# Patient Record
Sex: Female | Born: 1960 | Race: White | Hispanic: No | Marital: Married | Smoking: Never smoker
Health system: Southern US, Community
[De-identification: ages and names within clinical notes are randomized; demographics above are authoritative.]

## PROBLEM LIST (undated history)

## (undated) DIAGNOSIS — E119 Type 2 diabetes mellitus without complications: Secondary | ICD-10-CM

## (undated) DIAGNOSIS — I10 Essential (primary) hypertension: Secondary | ICD-10-CM

## (undated) DIAGNOSIS — F419 Anxiety disorder, unspecified: Secondary | ICD-10-CM

## (undated) DIAGNOSIS — K219 Gastro-esophageal reflux disease without esophagitis: Secondary | ICD-10-CM

## (undated) DIAGNOSIS — E079 Disorder of thyroid, unspecified: Secondary | ICD-10-CM

## (undated) HISTORY — PX: CHOLECYSTECTOMY: SHX55

---

## 2004-06-22 ENCOUNTER — Ambulatory Visit: Payer: Self-pay | Admitting: Internal Medicine

## 2005-08-22 ENCOUNTER — Ambulatory Visit: Payer: Self-pay | Admitting: Internal Medicine

## 2006-08-25 ENCOUNTER — Ambulatory Visit: Payer: Self-pay | Admitting: Internal Medicine

## 2008-04-13 ENCOUNTER — Ambulatory Visit: Payer: Self-pay | Admitting: Internal Medicine

## 2009-05-23 ENCOUNTER — Ambulatory Visit: Payer: Self-pay | Admitting: Internal Medicine

## 2010-05-09 ENCOUNTER — Ambulatory Visit: Payer: Self-pay | Admitting: Internal Medicine

## 2010-07-04 ENCOUNTER — Ambulatory Visit: Payer: Self-pay | Admitting: Internal Medicine

## 2011-07-11 ENCOUNTER — Ambulatory Visit: Payer: Self-pay | Admitting: Internal Medicine

## 2012-07-13 ENCOUNTER — Ambulatory Visit: Payer: Self-pay | Admitting: Internal Medicine

## 2013-09-29 ENCOUNTER — Ambulatory Visit: Payer: Self-pay | Admitting: Nurse Practitioner

## 2014-09-22 ENCOUNTER — Ambulatory Visit: Payer: Self-pay

## 2014-10-28 ENCOUNTER — Ambulatory Visit: Payer: Self-pay | Admitting: Nurse Practitioner

## 2015-06-24 ENCOUNTER — Encounter: Payer: Self-pay | Admitting: Gynecology

## 2015-06-24 ENCOUNTER — Ambulatory Visit
Admission: EM | Admit: 2015-06-24 | Discharge: 2015-06-24 | Disposition: A | Discharge status: Home / Self Care | Payer: BLUE CROSS/BLUE SHIELD | Attending: Internal Medicine | Admitting: Internal Medicine

## 2015-06-24 DIAGNOSIS — S86911A Strain of unspecified muscle(s) and tendon(s) at lower leg level, right leg, initial encounter: Secondary | ICD-10-CM

## 2015-06-24 DIAGNOSIS — S86811A Strain of other muscle(s) and tendon(s) at lower leg level, right leg, initial encounter: Secondary | ICD-10-CM | POA: Diagnosis not present

## 2015-06-24 HISTORY — DX: Essential (primary) hypertension: I10

## 2015-06-24 HISTORY — DX: Type 2 diabetes mellitus without complications: E11.9

## 2015-06-24 HISTORY — DX: Anxiety disorder, unspecified: F41.9

## 2015-06-24 HISTORY — DX: Gastro-esophageal reflux disease without esophagitis: K21.9

## 2015-06-24 HISTORY — DX: Disorder of thyroid, unspecified: E07.9

## 2015-06-24 NOTE — ED Notes (Signed)
Observed pt in [place], can bend and extend R knee/leg, able to walk on it but it hurts, feels it is "moving around" when she walks. Dr. Dayton ScrapeMurray notified.

## 2015-06-24 NOTE — ED Provider Notes (Addendum)
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 Yesterday, she tripped in the shower while cleaning, and fell, twisting her knee. Knee has been painful since, particularly with weightbearing. No visible swelling, does have some discomfort with flexion greater than 90. No bruising. No other injuries reported  Past Medical History  Diagnosis Date  . Hypertension   . Anxiety   . Diabetes mellitus without complication (HCC)   . GERD (gastroesophageal reflux disease)   . Thyroid disease    Past Surgical History  Procedure Laterality Date  . Cholecystectomy      Social History  Substance Use Topics  . Smoking status: Never Smoker   . Smokeless tobacco: None  . Alcohol Use: No    Review of Systems  All other systems reviewed and are negative.   Allergies  Review of patient's allergies indicates no known allergies.  Home Medications   Prior to Admission medications   Medication Sig Start Date End Date Taking? Authorizing Provider  citalopram (CELEXA) 10 MG tablet Take 10 mg by mouth daily.   Yes Historical Provider, MD  cyclobenzaprine (FLEXERIL) 10 MG tablet Take 10 mg by mouth 3 (three) times daily as needed for muscle spasms.   Yes Historical Provider, MD  levothyroxine (SYNTHROID, LEVOTHROID) 50 MCG tablet Take 50 mcg by mouth daily before breakfast.   Yes Historical Provider, MD  losartan (COZAAR) 25 MG tablet Take 25 mg by mouth daily.   Yes Historical Provider, MD  metFORMIN (GLUCOPHAGE) 500 MG tablet Take by mouth 2 (two) times daily with a meal.   Yes Historical Provider, MD  metoprolol tartrate (LOPRESSOR) 25 MG tablet Take 25 mg by mouth 2 (two) times daily.   Yes Historical Provider, MD  pantoprazole (PROTONIX) 40 MG tablet Take 40 mg by mouth daily.   Yes Historical Provider, MD      BP 131/70 mmHg  Pulse 77   Temp(Src) 98.2 F (36.8 C) (Oral)  Resp 18  Ht  (1.549 m)  Wt 256 lb (116.121 kg)  BMI 48.40 kg/m2  SpO2 98%   Physical Exam  Constitutional: She is oriented to person, place, and time. No distress.  Alert, nicely groomed Patient seated upright on the end of the exam table, cheerful  HENT:  Head: Atraumatic.  Eyes:  Conjugate gaze, no eye redness/drainage  Neck: Neck supple.  Cardiovascular: Normal rate.   Pulmonary/Chest: No respiratory distress.  Abdominal: She exhibits no distension.  Musculoskeletal:  No distal leg swelling No focal tenderness of the right knee to palpation, not bruised, contour of the knee is symmetric with the left knee. Possible small effusion, subtle decrease in the degree of flexion achieved on the right compared to the left knee. Discomfort with full flexion of the right knee, discomfort with full extension. Equivocal mild warmth to palpation of the right knee compared to the left, but no erythema.  Neurological: She is alert and oriented to person, place, and time.  Skin: Skin is warm and dry.  No cyanosis  Nursing note and vitals reviewed.   ED Course  Procedures (including critical care time)   MDM   1. Knee strain, right, initial encounter    Ice for 10-15 minutes several times a day for the next couple of days will help with knee pain. Aleve  OTC should also be helpful for knee pain. Prescriptions given for a 4 prong cane, and a knee sleeve-type brace. Anticipate gradual improvement over the next several weeks in knee pain.  Recheck or follow-up orthopedics for marked increase in pain or swelling, or if not improving as expected.  No x-rays were done today, because the likelihood of a bony fracture in the absence of focal tenderness, in a patient who is able to weight-bear is unlikely. However, if not starting to improve over the next week, should have knee reassessed.    Eustace MooreLaura W Kamyiah Colantonio, MD 06/24/15 1156  Eustace MooreLaura W Elijiah Mickley,  MD 06/25/15 2024

## 2015-06-24 NOTE — Discharge Instructions (Signed)
Ice for 10-15 minutes 2-4times daily for the next couple days to help with pain. We did not do an xray today because it is unlikely that bones are broken if you are able to walk at all.   However, if symptoms are not improving in 7-10 days, have knee rechecked or followup with orthopedist. Anticipate gradual improvement in knee pain over the next several weeks.   Recheck for marked increase in swelling/pain, or if not improving as expected. Activity as tolerated, if it hurts very badly, probably not ready to do that activity yet. Aleve otc may help with knee pain. Prescriptions given for 4-prong cane, to help with balance while knee hurts, and knee sleeve type brace for comfort.

## 2015-09-13 ENCOUNTER — Other Ambulatory Visit: Payer: Self-pay | Admitting: Nurse Practitioner

## 2015-09-13 DIAGNOSIS — Z1231 Encounter for screening mammogram for malignant neoplasm of breast: Secondary | ICD-10-CM

## 2015-10-31 ENCOUNTER — Ambulatory Visit
Admission: RE | Admit: 2015-10-31 | Discharge: 2015-10-31 | Disposition: A | Discharge status: Home / Self Care | Payer: BLUE CROSS/BLUE SHIELD | Source: Ambulatory Visit | Attending: Nurse Practitioner | Admitting: Nurse Practitioner

## 2015-10-31 DIAGNOSIS — Z1231 Encounter for screening mammogram for malignant neoplasm of breast: Secondary | ICD-10-CM | POA: Insufficient documentation

## 2016-08-09 ENCOUNTER — Other Ambulatory Visit: Payer: Self-pay | Admitting: Nurse Practitioner

## 2016-08-09 DIAGNOSIS — Z1231 Encounter for screening mammogram for malignant neoplasm of breast: Secondary | ICD-10-CM

## 2016-10-31 ENCOUNTER — Ambulatory Visit: Admission: RE | Admit: 2016-10-31 | Payer: BLUE CROSS/BLUE SHIELD | Source: Ambulatory Visit

## 2016-11-07 ENCOUNTER — Ambulatory Visit
Admission: RE | Admit: 2016-11-07 | Discharge: 2016-11-07 | Disposition: A | Discharge status: Home / Self Care | Payer: BLUE CROSS/BLUE SHIELD | Source: Ambulatory Visit | Attending: Nurse Practitioner | Admitting: Nurse Practitioner

## 2016-11-07 DIAGNOSIS — Z1231 Encounter for screening mammogram for malignant neoplasm of breast: Secondary | ICD-10-CM

## 2016-11-11 ENCOUNTER — Other Ambulatory Visit: Payer: Self-pay | Admitting: Nurse Practitioner

## 2016-11-11 DIAGNOSIS — R928 Other abnormal and inconclusive findings on diagnostic imaging of breast: Secondary | ICD-10-CM

## 2016-11-11 DIAGNOSIS — N6489 Other specified disorders of breast: Secondary | ICD-10-CM

## 2016-11-19 ENCOUNTER — Ambulatory Visit
Admission: RE | Admit: 2016-11-19 | Discharge: 2016-11-19 | Disposition: A | Discharge status: Home / Self Care | Payer: BLUE CROSS/BLUE SHIELD | Source: Ambulatory Visit | Attending: Nurse Practitioner | Admitting: Nurse Practitioner

## 2016-11-19 DIAGNOSIS — R928 Other abnormal and inconclusive findings on diagnostic imaging of breast: Secondary | ICD-10-CM

## 2016-11-19 DIAGNOSIS — N6489 Other specified disorders of breast: Secondary | ICD-10-CM

## 2016-11-25 ENCOUNTER — Other Ambulatory Visit: Payer: Self-pay | Admitting: Nurse Practitioner

## 2016-11-25 DIAGNOSIS — R928 Other abnormal and inconclusive findings on diagnostic imaging of breast: Secondary | ICD-10-CM

## 2016-11-25 DIAGNOSIS — N6489 Other specified disorders of breast: Secondary | ICD-10-CM

## 2016-11-28 ENCOUNTER — Ambulatory Visit
Admission: RE | Admit: 2016-11-28 | Discharge: 2016-11-28 | Disposition: A | Discharge status: Home / Self Care | Payer: BLUE CROSS/BLUE SHIELD | Source: Ambulatory Visit | Attending: Nurse Practitioner | Admitting: Nurse Practitioner

## 2016-11-28 DIAGNOSIS — N62 Hypertrophy of breast: Secondary | ICD-10-CM | POA: Insufficient documentation

## 2016-11-28 DIAGNOSIS — N6489 Other specified disorders of breast: Secondary | ICD-10-CM | POA: Diagnosis present

## 2016-11-28 DIAGNOSIS — R928 Other abnormal and inconclusive findings on diagnostic imaging of breast: Secondary | ICD-10-CM

## 2016-11-28 HISTORY — PX: BREAST BIOPSY: SHX20

## 2016-11-29 LAB — SURGICAL PATHOLOGY

## 2017-10-10 ENCOUNTER — Other Ambulatory Visit: Payer: Self-pay | Admitting: Nurse Practitioner

## 2017-10-10 DIAGNOSIS — Z1231 Encounter for screening mammogram for malignant neoplasm of breast: Secondary | ICD-10-CM

## 2017-11-11 ENCOUNTER — Ambulatory Visit
Admission: RE | Admit: 2017-11-11 | Discharge: 2017-11-11 | Disposition: A | Discharge status: Home / Self Care | Payer: BLUE CROSS/BLUE SHIELD | Source: Ambulatory Visit | Attending: Nurse Practitioner | Admitting: Nurse Practitioner

## 2017-11-11 DIAGNOSIS — Z1231 Encounter for screening mammogram for malignant neoplasm of breast: Secondary | ICD-10-CM | POA: Diagnosis not present

## 2018-02-04 IMAGING — MG MM DIGITAL DIAGNOSTIC UNILAT*R* W/ TOMO W/ CAD
6 of 9 series · 6 of 21 positions shown · non-contrast
Comparison: Previous exams including recent screening mammogram
dated 11/07/2016.

CLINICAL DATA: Patient returns today to evaluate a possible right
breast asymmetry identified on recent screening mammogram.

EXAM:
2D DIGITAL DIAGNOSTIC RIGHT MAMMOGRAM WITH CAD AND ADJUNCT TOMO
ULTRASOUND RIGHT BREAST

[R ML synth-2D]
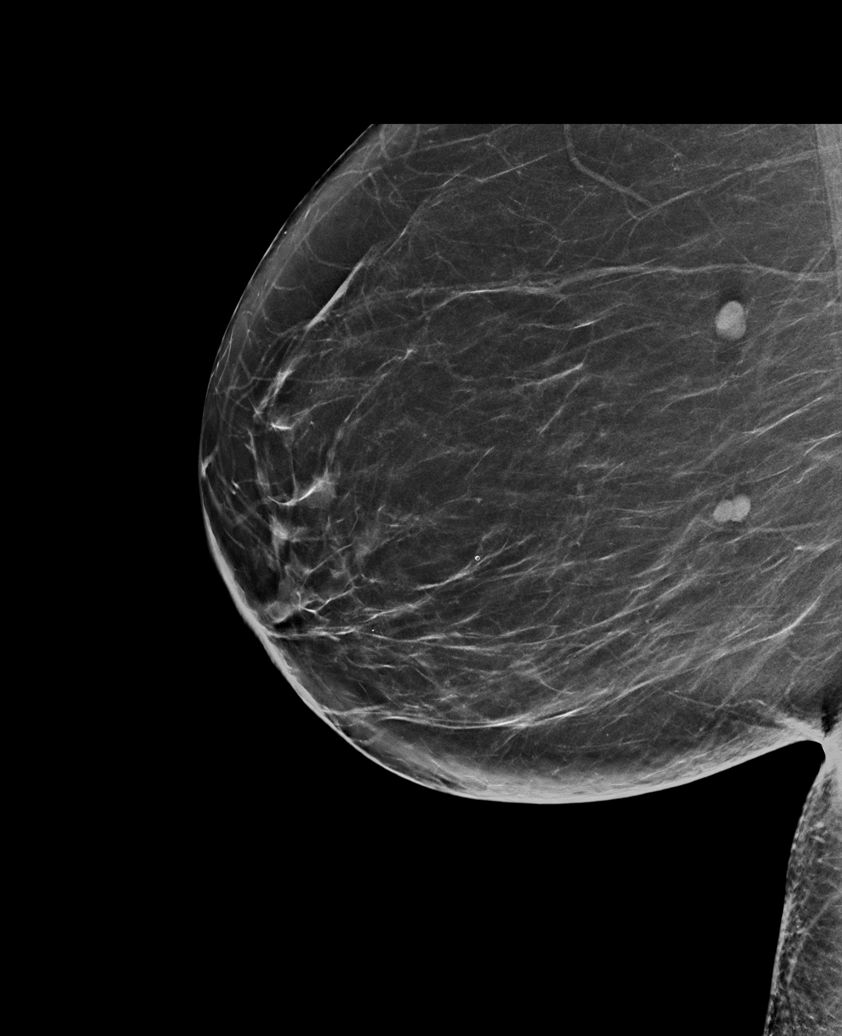

[R CC]
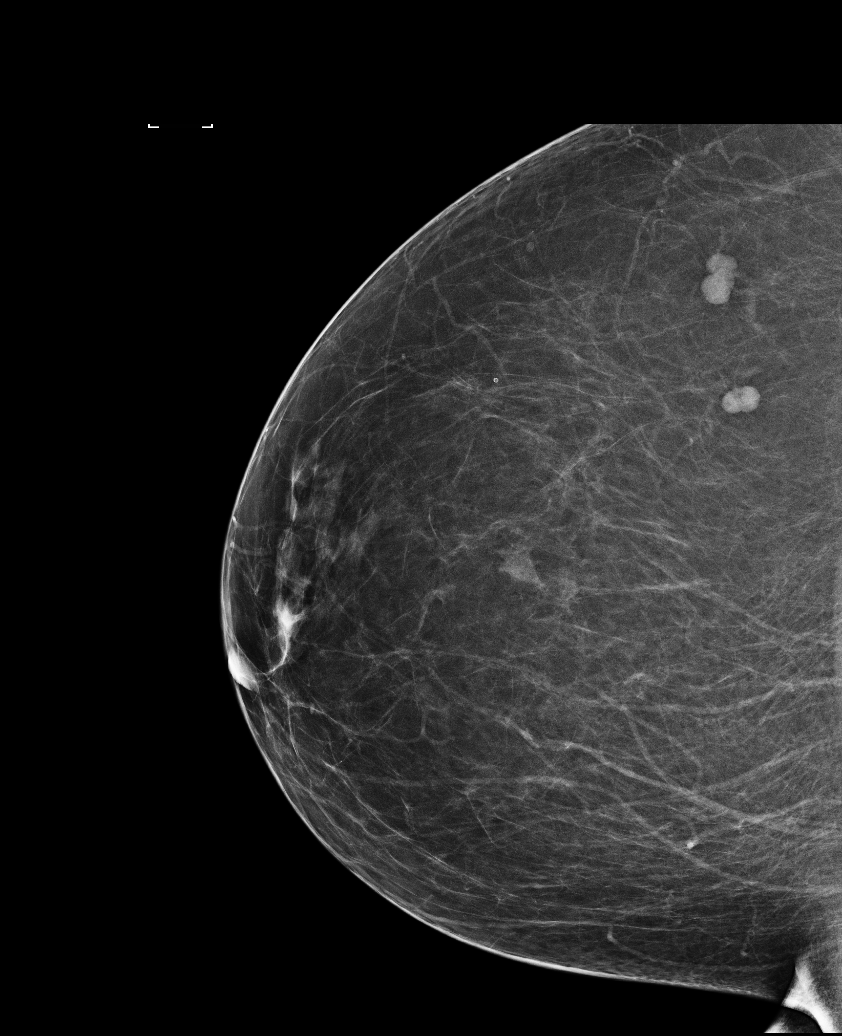

[R CC synth-2D]
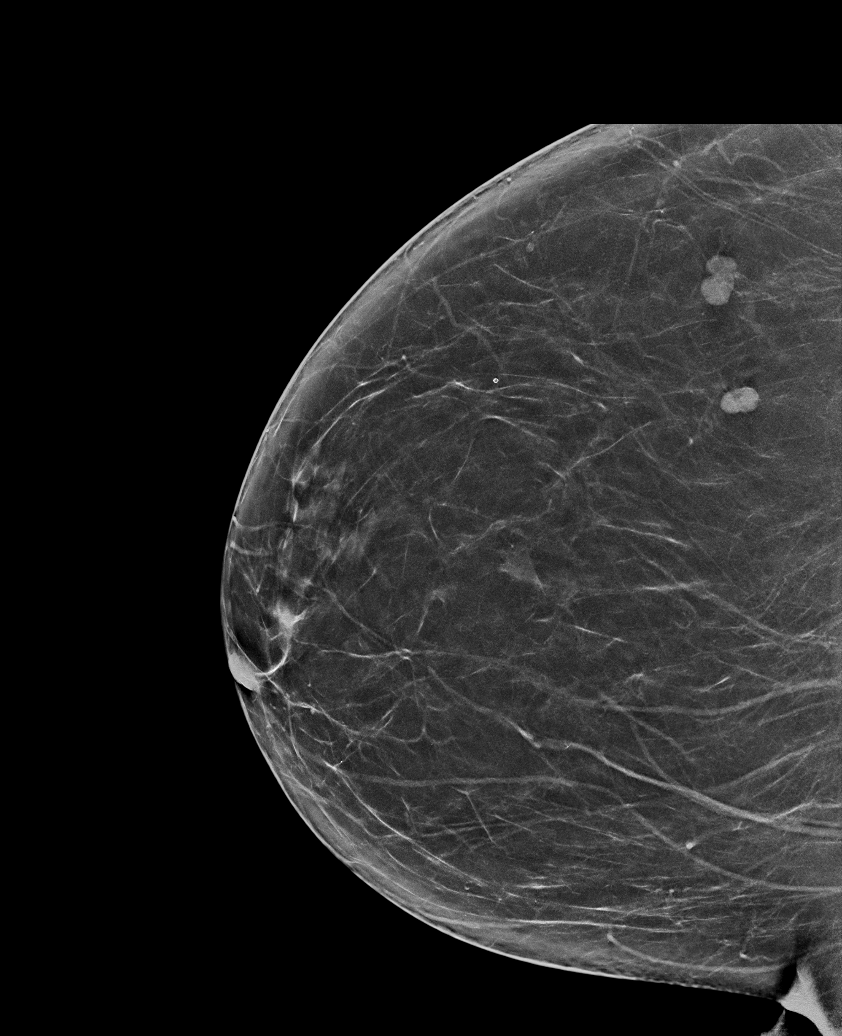

[R ML]
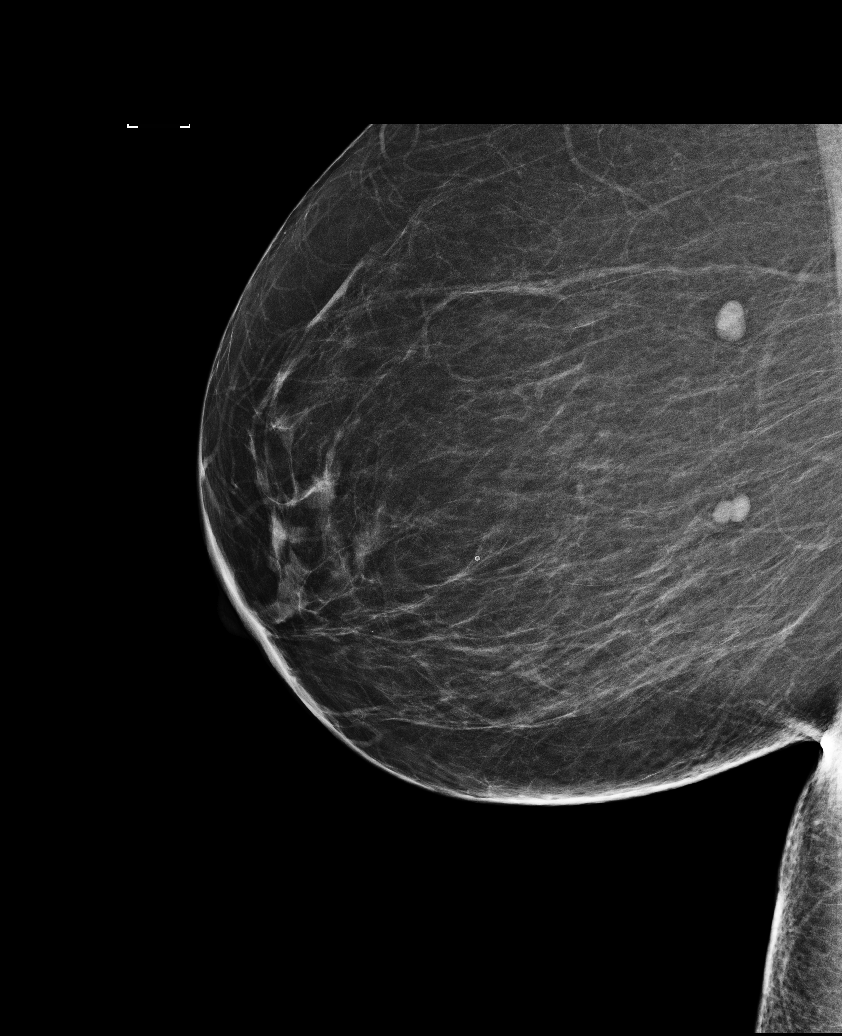

[R MLO]
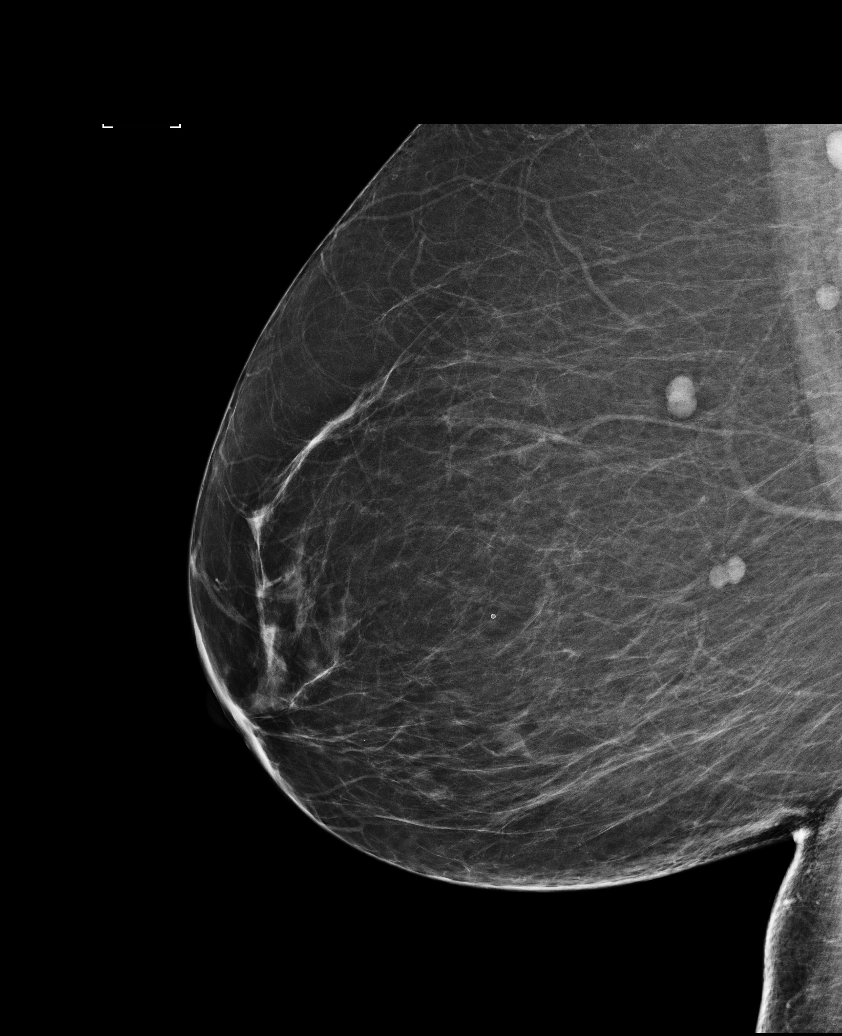

[R MLO synth-2D]
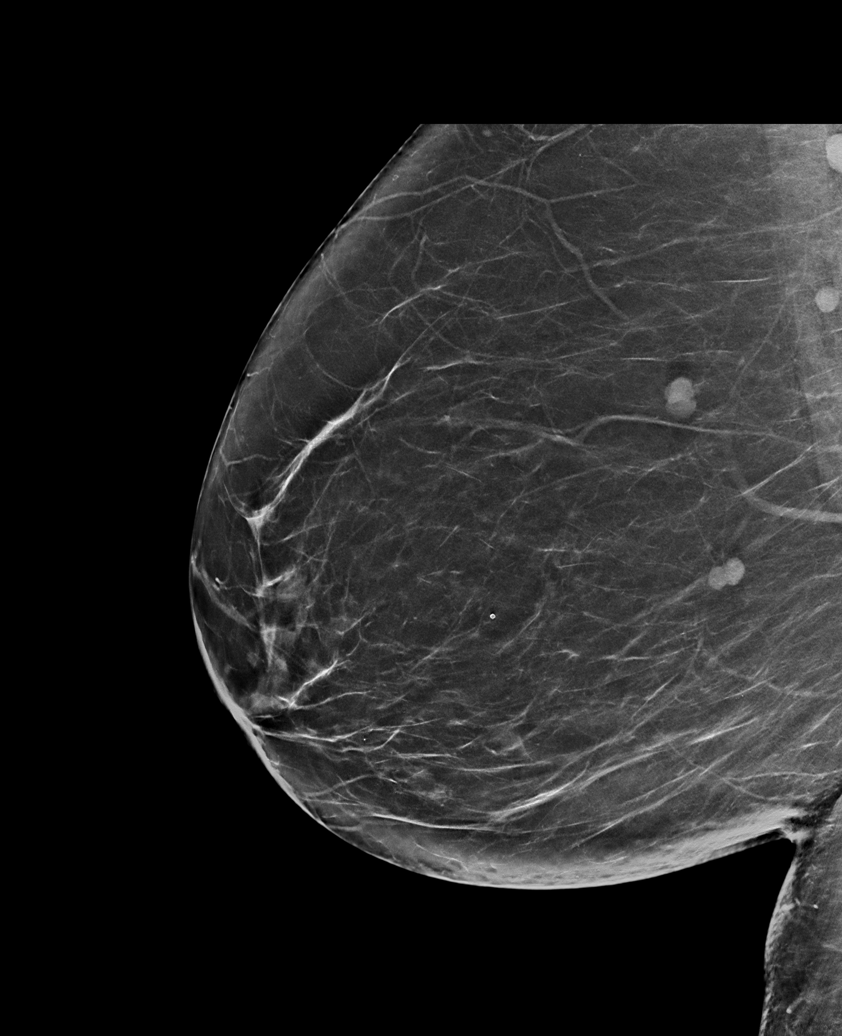

[6 of 21 positions shown; findings below may reference images not displayed]

ACR Breast Density Category b: There are scattered areas of
fibroglandular density.
FINDINGS: On today's additional views with spot compression and 3D
tomosynthesis, a developing asymmetry is confirmed within the lower
outer quadrant of the right breast, at middle depth, measuring
approximately 1.3 cm greatest dimension.

Mammographic images were processed with CAD.

Targeted ultrasound is performed, evaluating the lower outer
quadrant of the right breast corresponding to the area of
mammographic finding, showing only normal fibroglandular tissues and
fat lobules throughout. No suspicious solid or cystic mass is
identified.
IMPRESSION: Developing asymmetry within the lower outer quadrant of the right
breast, without sonographic correlate, for which stereotactic biopsy
is recommended.

RECOMMENDATION:
Stereotactic biopsy, with 3D tomosynthesis guidance, for the
developing asymmetry within the lower outer quadrant of the right
breast.

Ordering physician will be contacted with today's results and
patient will then be scheduled for stereotactic biopsy at her
earliest convenience.

I have discussed the findings and recommendations with the patient.
Results were also provided in writing at the conclusion of the
visit. If applicable, a reminder letter will be sent to the patient
regarding the next appointment.

BI-RADS CATEGORY  4: Suspicious.

## 2018-07-08 ENCOUNTER — Other Ambulatory Visit: Payer: Self-pay | Admitting: Nurse Practitioner

## 2018-07-08 DIAGNOSIS — Z1231 Encounter for screening mammogram for malignant neoplasm of breast: Secondary | ICD-10-CM

## 2019-01-13 ENCOUNTER — Other Ambulatory Visit: Payer: Self-pay

## 2019-01-13 ENCOUNTER — Ambulatory Visit
Admission: RE | Admit: 2019-01-13 | Discharge: 2019-01-13 | Disposition: A | Discharge status: Home / Self Care | Payer: BC Managed Care – PPO | Source: Ambulatory Visit | Attending: Nurse Practitioner | Admitting: Nurse Practitioner

## 2019-01-13 DIAGNOSIS — Z1231 Encounter for screening mammogram for malignant neoplasm of breast: Secondary | ICD-10-CM | POA: Insufficient documentation

## 2019-12-15 ENCOUNTER — Other Ambulatory Visit: Payer: Self-pay | Admitting: Nurse Practitioner

## 2019-12-15 DIAGNOSIS — Z1231 Encounter for screening mammogram for malignant neoplasm of breast: Secondary | ICD-10-CM

## 2020-01-14 ENCOUNTER — Ambulatory Visit
Admission: RE | Admit: 2020-01-14 | Discharge: 2020-01-14 | Disposition: A | Discharge status: Home / Self Care | Payer: BC Managed Care – PPO | Source: Ambulatory Visit | Attending: Nurse Practitioner | Admitting: Nurse Practitioner

## 2020-01-14 DIAGNOSIS — Z1231 Encounter for screening mammogram for malignant neoplasm of breast: Secondary | ICD-10-CM | POA: Diagnosis not present

## 2020-05-10 ENCOUNTER — Ambulatory Visit: Payer: Self-pay | Admitting: Podiatry

## 2020-12-01 ENCOUNTER — Other Ambulatory Visit: Payer: Self-pay | Admitting: Nurse Practitioner

## 2020-12-01 DIAGNOSIS — Z1231 Encounter for screening mammogram for malignant neoplasm of breast: Secondary | ICD-10-CM

## 2021-01-17 ENCOUNTER — Ambulatory Visit
Admission: RE | Admit: 2021-01-17 | Discharge: 2021-01-17 | Disposition: A | Discharge status: Home / Self Care | Payer: BC Managed Care – PPO | Source: Ambulatory Visit | Attending: Nurse Practitioner | Admitting: Nurse Practitioner

## 2021-01-17 ENCOUNTER — Other Ambulatory Visit: Payer: Self-pay

## 2021-01-17 DIAGNOSIS — Z1231 Encounter for screening mammogram for malignant neoplasm of breast: Secondary | ICD-10-CM | POA: Insufficient documentation

## 2022-02-01 ENCOUNTER — Other Ambulatory Visit: Payer: Self-pay | Admitting: Nurse Practitioner

## 2022-02-01 DIAGNOSIS — Z1231 Encounter for screening mammogram for malignant neoplasm of breast: Secondary | ICD-10-CM

## 2022-02-20 ENCOUNTER — Ambulatory Visit
Admission: RE | Admit: 2022-02-20 | Discharge: 2022-02-20 | Disposition: A | Discharge status: Home / Self Care | Payer: BC Managed Care – PPO | Source: Ambulatory Visit | Attending: Nurse Practitioner | Admitting: Nurse Practitioner

## 2022-02-20 DIAGNOSIS — Z1231 Encounter for screening mammogram for malignant neoplasm of breast: Secondary | ICD-10-CM | POA: Diagnosis present

## 2022-02-21 ENCOUNTER — Other Ambulatory Visit: Payer: Self-pay | Admitting: Nurse Practitioner

## 2022-02-21 ENCOUNTER — Ambulatory Visit: Payer: BC Managed Care – PPO

## 2022-02-21 DIAGNOSIS — R928 Other abnormal and inconclusive findings on diagnostic imaging of breast: Secondary | ICD-10-CM

## 2022-02-21 DIAGNOSIS — R921 Mammographic calcification found on diagnostic imaging of breast: Secondary | ICD-10-CM

## 2022-02-25 ENCOUNTER — Other Ambulatory Visit: Payer: Self-pay | Admitting: Nurse Practitioner

## 2022-02-25 DIAGNOSIS — R928 Other abnormal and inconclusive findings on diagnostic imaging of breast: Secondary | ICD-10-CM

## 2022-02-25 DIAGNOSIS — R921 Mammographic calcification found on diagnostic imaging of breast: Secondary | ICD-10-CM

## 2022-03-13 ENCOUNTER — Ambulatory Visit
Admission: RE | Admit: 2022-03-13 | Discharge: 2022-03-13 | Disposition: A | Discharge status: Home / Self Care | Payer: BC Managed Care – PPO | Source: Ambulatory Visit | Attending: Nurse Practitioner | Admitting: Nurse Practitioner

## 2022-03-13 DIAGNOSIS — R921 Mammographic calcification found on diagnostic imaging of breast: Secondary | ICD-10-CM

## 2022-03-13 DIAGNOSIS — R928 Other abnormal and inconclusive findings on diagnostic imaging of breast: Secondary | ICD-10-CM | POA: Insufficient documentation

## 2022-03-14 ENCOUNTER — Other Ambulatory Visit: Payer: Self-pay | Admitting: Nurse Practitioner

## 2022-03-14 DIAGNOSIS — R928 Other abnormal and inconclusive findings on diagnostic imaging of breast: Secondary | ICD-10-CM

## 2022-03-14 DIAGNOSIS — R921 Mammographic calcification found on diagnostic imaging of breast: Secondary | ICD-10-CM

## 2022-03-20 ENCOUNTER — Ambulatory Visit
Admission: RE | Admit: 2022-03-20 | Discharge: 2022-03-20 | Disposition: A | Discharge status: Home / Self Care | Payer: BC Managed Care – PPO | Source: Ambulatory Visit | Attending: Nurse Practitioner | Admitting: Nurse Practitioner

## 2022-03-20 DIAGNOSIS — R928 Other abnormal and inconclusive findings on diagnostic imaging of breast: Secondary | ICD-10-CM | POA: Diagnosis present

## 2022-03-20 DIAGNOSIS — R921 Mammographic calcification found on diagnostic imaging of breast: Secondary | ICD-10-CM | POA: Diagnosis present

## 2022-03-20 HISTORY — PX: BREAST BIOPSY: SHX20

## 2022-03-21 LAB — SURGICAL PATHOLOGY

## 2022-10-10 ENCOUNTER — Other Ambulatory Visit: Payer: Self-pay

## 2022-10-10 MED ORDER — MOUNJARO 10 MG/0.5ML ~~LOC~~ SOAJ
10.0000 mg | SUBCUTANEOUS | 3 refills | Status: DC
Start: 2022-10-10 — End: 2022-11-11

## 2022-10-11 ENCOUNTER — Encounter: Payer: Self-pay | Admitting: Nurse Practitioner

## 2022-11-11 ENCOUNTER — Other Ambulatory Visit: Payer: Self-pay | Admitting: Nurse Practitioner

## 2022-12-06 ENCOUNTER — Other Ambulatory Visit: Payer: Self-pay | Admitting: Nurse Practitioner

## 2022-12-06 ENCOUNTER — Other Ambulatory Visit: Payer: Managed Care, Other (non HMO)

## 2022-12-07 LAB — COMPREHENSIVE METABOLIC PANEL
ALT: 24 IU/L (ref 0–32)
AST: 30 IU/L (ref 0–40)
Albumin/Globulin Ratio: 2.2 (ref 1.2–2.2)
Albumin: 4.1 g/dL (ref 3.9–4.9)
Alkaline Phosphatase: 75 IU/L (ref 44–121)
BUN/Creatinine Ratio: 21 (ref 12–28)
BUN: 13 mg/dL (ref 8–27)
Bilirubin Total: 0.6 mg/dL (ref 0.0–1.2)
CO2: 24 mmol/L (ref 20–29)
Calcium: 9.1 mg/dL (ref 8.7–10.3)
Chloride: 103 mmol/L (ref 96–106)
Creatinine, Ser: 0.62 mg/dL (ref 0.57–1.00)
Globulin, Total: 1.9 g/dL (ref 1.5–4.5)
Glucose: 72 mg/dL (ref 70–99)
Potassium: 4.3 mmol/L (ref 3.5–5.2)
Sodium: 141 mmol/L (ref 134–144)
Total Protein: 6 g/dL (ref 6.0–8.5)
eGFR: 101 mL/min/{1.73_m2} (ref >59)

## 2022-12-07 LAB — LIPID PANEL W/O CHOL/HDL RATIO
Cholesterol, Total: 106 mg/dL (ref 100–199)
HDL: 46 mg/dL (ref >39)
LDL Chol Calc (NIH): 43 mg/dL (ref 0–99)
Triglycerides: 87 mg/dL (ref 0–149)
VLDL Cholesterol Cal: 17 mg/dL (ref 5–40)

## 2022-12-07 LAB — HGB A1C W/O EAG: Hgb A1c MFr Bld: 5.7 % — ABNORMAL HIGH (ref 4.8–5.6)

## 2022-12-07 LAB — TSH: TSH: 1.59 u[IU]/mL (ref 0.450–4.500)

## 2022-12-09 ENCOUNTER — Ambulatory Visit: Payer: Managed Care, Other (non HMO) | Admitting: Nurse Practitioner

## 2022-12-09 ENCOUNTER — Other Ambulatory Visit: Payer: Self-pay | Admitting: Nurse Practitioner

## 2022-12-09 VITALS — BP 116/64 | HR 81 | Ht 61.0 in | Wt 127.0 lb

## 2022-12-09 DIAGNOSIS — E1169 Type 2 diabetes mellitus with other specified complication: Secondary | ICD-10-CM

## 2022-12-09 DIAGNOSIS — R5383 Other fatigue: Secondary | ICD-10-CM

## 2022-12-09 DIAGNOSIS — R7303 Prediabetes: Secondary | ICD-10-CM

## 2022-12-09 DIAGNOSIS — B009 Herpesviral infection, unspecified: Secondary | ICD-10-CM

## 2022-12-09 MED ORDER — CETIRIZINE HCL 10 MG PO TABS: 10.0000 mg | ORAL_TABLET | Freq: Every day | ORAL | 2 refills | Status: DC

## 2022-12-09 MED ORDER — VALACYCLOVIR HCL 1 G PO TABS: 1000.0000 mg | ORAL_TABLET | Freq: Two times a day (BID) | ORAL | 3 refills | Status: AC

## 2022-12-09 MED ORDER — MOUNJARO 10 MG/0.5ML ~~LOC~~ SOAJ: SUBCUTANEOUS | 3 refills | Status: DC

## 2022-12-09 MED ORDER — CICLOPIROX 8 % EX SOLN: Freq: Every day | CUTANEOUS | 0 refills | Status: DC

## 2022-12-09 NOTE — Patient Instructions (Signed)
1) cut losartan 25 mg in half, take 1/2 tab daily 2) Take 1 metoprolol 25 mg in AM, do not take 2nd dose and only for 2 weeks, then stop 3) Stop Jardiance 4) Follow up appt in 6 months, RTC 3 days prior for fasting labs

## 2022-12-09 NOTE — Progress Notes (Signed)
Established Patient Office Visit  Subjective:  Patient ID: Shannon Sampson, female    DOB: 1961/04/14  Age: 62 y.o. MRN: [id number]  Chief Complaint  Patient presents with   Follow-up    4 Months Follow Up    4 month follow up with lab results review.  A1c at 5.7%.  Patient has successfully lost 90 lbs.  LDL at 43 mg/dl.      No other concerns at this time.   Past Medical History:  Diagnosis Date   Anxiety    Diabetes mellitus without complication (HCC)    GERD (gastroesophageal reflux disease)    Hypertension    Thyroid disease     Past Surgical History:  Procedure Laterality Date   BREAST BIOPSY Right 11/28/2016   neg   BREAST BIOPSY Left 03/20/2022   Left Breast Stereo BX, Coil Clip- path pending   CHOLECYSTECTOMY      Social History   Socioeconomic History   Marital status: Married    Spouse name: Not on file   Number of children: Not on file   Years of education: Not on file   Highest education level: Not on file  Occupational History   Not on file  Tobacco Use   Smoking status: Never   Smokeless tobacco: Not on file  Substance and Sexual Activity   Alcohol use: No   Drug use: Not on file   Sexual activity: Not on file  Other Topics Concern   Not on file  Social History Narrative   Not on file   Social Determinants of Health   Financial Resource Strain: Not on file  Food Insecurity: Not on file  Transportation Needs: Not on file  Physical Activity: Not on file  Stress: Not on file  Social Connections: Not on file  Intimate Partner Violence: Not on file    Family History  Problem Relation Age of Onset   Breast cancer Neg Hx     No Known Allergies  Review of Systems  Constitutional: Negative.   HENT: Negative.    Eyes: Negative.   Respiratory: Negative.    Cardiovascular: Negative.   Gastrointestinal: Negative.   Genitourinary: Negative.   Musculoskeletal: Negative.   Skin: Negative.   Neurological: Negative.    Endo/Heme/Allergies: Negative.   Psychiatric/Behavioral: Negative.         Objective:   BP 116/64   Pulse 81   Ht 5\' 1"  (1.549 m)   Wt 127 lb (57.6 kg)   SpO2 98%   BMI 24.00 kg/m   Vitals:   12/09/22 0902  BP: 116/64  Pulse: 81  Height: 5\' 1"  (1.549 m)  Weight: 127 lb (57.6 kg)  SpO2: 98%  BMI (Calculated): 24.01    Physical Exam Vitals reviewed.  Constitutional:      Appearance: Normal appearance.  HENT:     Head: Normocephalic.     Nose: Nose normal.     Mouth/Throat:     Mouth: Mucous membranes are moist.  Eyes:     Pupils: Pupils are equal, round, and reactive to light.  Cardiovascular:     Rate and Rhythm: Normal rate and regular rhythm.  Pulmonary:     Effort: Pulmonary effort is normal.     Breath sounds: Normal breath sounds.  Abdominal:     General: Bowel sounds are normal.     Palpations: Abdomen is soft.  Musculoskeletal:        General: Normal range of motion.     Cervical  back: Normal range of motion and neck supple.  Skin:    General: Skin is warm and dry.  Neurological:     Mental Status: She is alert and oriented to person, place, and time.  Psychiatric:        Mood and Affect: Mood normal.        Behavior: Behavior normal.      No results found for any visits on 12/09/22.  Recent Results (from the past 2160 hour(s))  Comprehensive metabolic panel     Status: None   Collection Time: 12/06/22  9:43 AM  Result Value Ref Range   Glucose 72 70 - 99 mg/dL   BUN 13 8 - 27 mg/dL   Creatinine, Ser 1.61 0.57 - 1.00 mg/dL   eGFR 096 >04 VW/UJW/1.19   BUN/Creatinine Ratio 21 12 - 28   Sodium 141 134 - 144 mmol/L   Potassium 4.3 3.5 - 5.2 mmol/L   Chloride 103 96 - 106 mmol/L   CO2 24 20 - 29 mmol/L   Calcium 9.1 8.7 - 10.3 mg/dL   Total Protein 6.0 6.0 - 8.5 g/dL   Albumin 4.1 3.9 - 4.9 g/dL   Globulin, Total 1.9 1.5 - 4.5 g/dL   Albumin/Globulin Ratio 2.2 1.2 - 2.2   Bilirubin Total 0.6 0.0 - 1.2 mg/dL   Alkaline Phosphatase 75  44 - 121 IU/L   AST 30 0 - 40 IU/L   ALT 24 0 - 32 IU/L  Lipid Panel w/o Chol/HDL Ratio     Status: None   Collection Time: 12/06/22  9:43 AM  Result Value Ref Range   Cholesterol, Total 106 100 - 199 mg/dL   Triglycerides 87 0 - 149 mg/dL   HDL 46 >14 mg/dL   VLDL Cholesterol Cal 17 5 - 40 mg/dL   LDL Chol Calc (NIH) 43 0 - 99 mg/dL  Hgb N8G w/o eAG     Status: Abnormal   Collection Time: 12/06/22  9:43 AM  Result Value Ref Range   Hgb A1c MFr Bld 5.7 (H) 4.8 - 5.6 %    Comment:          Prediabetes: 5.7 - 6.4          Diabetes: >6.4          Glycemic control for adults with diabetes: <7.0   TSH     Status: None   Collection Time: 12/06/22  9:43 AM  Result Value Ref Range   TSH 1.590 0.450 - 4.500 uIU/mL      Assessment & Plan:   Problem List Items Addressed This Visit   None Visit Diagnoses     HSV (herpes simplex virus) infection    -  Primary   Relevant Medications   valACYclovir (VALTREX) 1000 MG tablet   ciclopirox (PENLAC) 8 % solution   Type 2 diabetes mellitus with other specified complication, without long-term current use of insulin (HCC)       Relevant Medications   rosuvastatin (CRESTOR) 10 MG tablet   MOUNJARO 10 MG/0.5ML Pen       Return in about 6 months (around 06/11/2023).   Total time spent: 35 minutes  Orson Eva, NP  12/09/2022

## 2023-01-13 ENCOUNTER — Ambulatory Visit: Payer: Managed Care, Other (non HMO) | Admitting: Nurse Practitioner

## 2023-06-06 ENCOUNTER — Other Ambulatory Visit: Payer: Managed Care, Other (non HMO)

## 2023-06-06 DIAGNOSIS — R7303 Prediabetes: Secondary | ICD-10-CM

## 2023-06-06 DIAGNOSIS — I10 Essential (primary) hypertension: Secondary | ICD-10-CM

## 2023-06-06 DIAGNOSIS — E785 Hyperlipidemia, unspecified: Secondary | ICD-10-CM

## 2023-06-07 LAB — CMP14+EGFR
ALT: 28 [IU]/L (ref 0–32)
AST: 30 [IU]/L (ref 0–40)
Albumin: 3.9 g/dL (ref 3.9–4.9)
Alkaline Phosphatase: 81 [IU]/L (ref 44–121)
BUN/Creatinine Ratio: 27 (ref 12–28)
BUN: 18 mg/dL (ref 8–27)
Bilirubin Total: 0.4 mg/dL (ref 0.0–1.2)
CO2: 27 mmol/L (ref 20–29)
Calcium: 8.8 mg/dL (ref 8.7–10.3)
Chloride: 107 mmol/L — ABNORMAL HIGH (ref 96–106)
Creatinine, Ser: 0.67 mg/dL (ref 0.57–1.00)
Globulin, Total: 1.8 g/dL (ref 1.5–4.5)
Glucose: 76 mg/dL (ref 70–99)
Potassium: 4.2 mmol/L (ref 3.5–5.2)
Sodium: 144 mmol/L (ref 134–144)
Total Protein: 5.7 g/dL — ABNORMAL LOW (ref 6.0–8.5)
eGFR: 99 mL/min/{1.73_m2} (ref >59)

## 2023-06-07 LAB — LIPID PANEL
Chol/HDL Ratio: 1.9 ratio (ref 0.0–4.4)
Cholesterol, Total: 103 mg/dL (ref 100–199)
HDL: 54 mg/dL (ref >39)
LDL Chol Calc (NIH): 35 mg/dL (ref 0–99)
Triglycerides: 66 mg/dL (ref 0–149)
VLDL Cholesterol Cal: 14 mg/dL (ref 5–40)

## 2023-06-07 LAB — HEMOGLOBIN A1C
Est. average glucose Bld gHb Est-mCnc: 108 mg/dL
Hgb A1c MFr Bld: 5.4 % (ref 4.8–5.6)

## 2023-06-07 LAB — TSH: TSH: 2.1 u[IU]/mL (ref 0.450–4.500)

## 2023-06-09 ENCOUNTER — Ambulatory Visit: Payer: Managed Care, Other (non HMO) | Admitting: Cardiology

## 2023-06-09 ENCOUNTER — Encounter: Payer: Self-pay | Admitting: Cardiology

## 2023-06-09 VITALS — BP 136/84 | HR 71 | Ht 61.0 in | Wt 119.0 lb

## 2023-06-09 DIAGNOSIS — I10 Essential (primary) hypertension: Secondary | ICD-10-CM | POA: Insufficient documentation

## 2023-06-09 DIAGNOSIS — E1169 Type 2 diabetes mellitus with other specified complication: Secondary | ICD-10-CM | POA: Diagnosis not present

## 2023-06-09 DIAGNOSIS — Z1231 Encounter for screening mammogram for malignant neoplasm of breast: Secondary | ICD-10-CM

## 2023-06-09 DIAGNOSIS — Z1211 Encounter for screening for malignant neoplasm of colon: Secondary | ICD-10-CM

## 2023-06-09 DIAGNOSIS — E782 Mixed hyperlipidemia: Secondary | ICD-10-CM | POA: Diagnosis not present

## 2023-06-09 DIAGNOSIS — E039 Hypothyroidism, unspecified: Secondary | ICD-10-CM | POA: Diagnosis not present

## 2023-06-09 DIAGNOSIS — E119 Type 2 diabetes mellitus without complications: Secondary | ICD-10-CM | POA: Insufficient documentation

## 2023-06-09 MED ORDER — CETIRIZINE HCL 10 MG PO TABS: 10.0000 mg | ORAL_TABLET | Freq: Every day | ORAL | 2 refills | Status: DC

## 2023-06-09 MED ORDER — LOSARTAN POTASSIUM 25 MG PO TABS: 12.5000 mg | ORAL_TABLET | Freq: Every day | ORAL | 3 refills | Status: DC

## 2023-06-09 MED ORDER — LEVOTHYROXINE SODIUM 50 MCG PO TABS: 50.0000 ug | ORAL_TABLET | Freq: Every day | ORAL | 3 refills | Status: DC

## 2023-06-09 MED ORDER — CYCLOBENZAPRINE HCL 10 MG PO TABS
10.0000 mg | ORAL_TABLET | Freq: Three times a day (TID) | ORAL | 5 refills | Status: DC | PRN
Start: 2023-06-09 — End: 2024-03-01

## 2023-06-09 MED ORDER — ROSUVASTATIN CALCIUM 10 MG PO TABS: 10.0000 mg | ORAL_TABLET | Freq: Every day | ORAL | 3 refills | Status: DC

## 2023-06-09 MED ORDER — MOUNJARO 10 MG/0.5ML ~~LOC~~ SOAJ: SUBCUTANEOUS | 11 refills | Status: DC

## 2023-06-09 NOTE — Progress Notes (Signed)
Established Patient Office Visit  Subjective:  Patient ID: Shannon Sampson, female    DOB: 06-Jan-1961  Age: 62 y.o. MRN: [id number]  Chief Complaint  Patient presents with   Follow-up    6 months follow up    Patient in office for 6 month follow up, discuss recent lab results. Patient reports feeling well. No complaints today. Patient taking and tolerating her medications.  Discussed recent lab work. LDL and Hgb A1c well controlled.  Patient states she is scheduled for a DEE in 2 weeks.  Over due for mammogram, order placed.  Over due for Cologuard, order placed.  Last pap smear 10/2016 negative. Will schedule at next visit.    No other concerns at this time.   Past Medical History:  Diagnosis Date   Anxiety    Diabetes mellitus without complication (HCC)    GERD (gastroesophageal reflux disease)    Hypertension    Thyroid disease     Past Surgical History:  Procedure Laterality Date   BREAST BIOPSY Right 11/28/2016   neg   BREAST BIOPSY Left 03/20/2022   Left Breast Stereo BX, Coil Clip- path pending   CHOLECYSTECTOMY      Social History   Socioeconomic History   Marital status: Married    Spouse name: Not on file   Number of children: Not on file   Years of education: Not on file   Highest education level: Not on file  Occupational History   Not on file  Tobacco Use   Smoking status: Never   Smokeless tobacco: Not on file  Substance and Sexual Activity   Alcohol use: No   Drug use: Not on file   Sexual activity: Not on file  Other Topics Concern   Not on file  Social History Narrative   Not on file   Social Determinants of Health   Financial Resource Strain: Not on file  Food Insecurity: Not on file  Transportation Needs: Not on file  Physical Activity: Not on file  Stress: Not on file  Social Connections: Not on file  Intimate Partner Violence: Not on file    Family History  Problem Relation Age of Onset   Breast cancer Neg Hx      Allergies  Allergen Reactions   Cephalexin Rash   Propoxyphene Nausea Only    Review of Systems  Constitutional: Negative.   HENT: Negative.    Eyes: Negative.   Respiratory: Negative.  Negative for shortness of breath.   Cardiovascular: Negative.  Negative for chest pain.  Gastrointestinal: Negative.  Negative for abdominal pain, constipation and diarrhea.  Genitourinary: Negative.   Musculoskeletal:  Negative for joint pain and myalgias.  Skin: Negative.   Neurological: Negative.  Negative for dizziness and headaches.  Endo/Heme/Allergies: Negative.   All other systems reviewed and are negative.      Objective:   BP 136/84 (BP Location: Left Arm, Patient Position: Sitting, Cuff Size: Small)   Pulse 71   Ht 5\' 1"  (1.549 m)   Wt 119 lb (54 kg)   SpO2 95%   BMI 22.48 kg/m   Vitals:   06/09/23 0903 06/09/23 0926  BP: 138/68 136/84  Pulse: 71   Height: 5\' 1"  (1.549 m)   Weight: 119 lb (54 kg)   SpO2: 95%   BMI (Calculated): 22.5     Physical Exam Vitals and nursing note reviewed.  Constitutional:      Appearance: Normal appearance. She is normal weight.  HENT:  Head: Normocephalic and atraumatic.     Nose: Nose normal.     Mouth/Throat:     Mouth: Mucous membranes are moist.  Eyes:     Extraocular Movements: Extraocular movements intact.     Conjunctiva/sclera: Conjunctivae normal.     Pupils: Pupils are equal, round, and reactive to light.  Cardiovascular:     Rate and Rhythm: Normal rate and regular rhythm.     Pulses: Normal pulses.     Heart sounds: Normal heart sounds.  Pulmonary:     Effort: Pulmonary effort is normal.     Breath sounds: Normal breath sounds.  Abdominal:     General: Abdomen is flat. Bowel sounds are normal.     Palpations: Abdomen is soft.  Musculoskeletal:        General: Normal range of motion.     Cervical back: Normal range of motion.  Skin:    General: Skin is warm and dry.  Neurological:     General: No focal  deficit present.     Mental Status: She is alert and oriented to person, place, and time.  Psychiatric:        Mood and Affect: Mood normal.        Behavior: Behavior normal.        Thought Content: Thought content normal.        Judgment: Judgment normal.      No results found for any visits on 06/09/23.  Recent Results (from the past 2160 hour(s))  Hemoglobin A1c     Status: None   Collection Time: 06/06/23  8:37 AM  Result Value Ref Range   Hgb A1c MFr Bld 5.4 4.8 - 5.6 %    Comment:          Prediabetes: 5.7 - 6.4          Diabetes: >6.4          Glycemic control for adults with diabetes: <7.0    Est. average glucose Bld gHb Est-mCnc 108 mg/dL  TSH     Status: None   Collection Time: 06/06/23  8:37 AM  Result Value Ref Range   TSH 2.100 0.450 - 4.500 uIU/mL  CMP14+EGFR     Status: Abnormal   Collection Time: 06/06/23  8:37 AM  Result Value Ref Range   Glucose 76 70 - 99 mg/dL   BUN 18 8 - 27 mg/dL   Creatinine, Ser 1.61 0.57 - 1.00 mg/dL   eGFR 99 >09 UE/AVW/0.98   BUN/Creatinine Ratio 27 12 - 28   Sodium 144 134 - 144 mmol/L   Potassium 4.2 3.5 - 5.2 mmol/L   Chloride 107 (H) 96 - 106 mmol/L   CO2 27 20 - 29 mmol/L   Calcium 8.8 8.7 - 10.3 mg/dL   Total Protein 5.7 (L) 6.0 - 8.5 g/dL   Albumin 3.9 3.9 - 4.9 g/dL   Globulin, Total 1.8 1.5 - 4.5 g/dL   Bilirubin Total 0.4 0.0 - 1.2 mg/dL   Alkaline Phosphatase 81 44 - 121 IU/L   AST 30 0 - 40 IU/L   ALT 28 0 - 32 IU/L  Lipid panel     Status: None   Collection Time: 06/06/23  8:37 AM  Result Value Ref Range   Cholesterol, Total 103 100 - 199 mg/dL   Triglycerides 66 0 - 149 mg/dL   HDL 54 >11 mg/dL   VLDL Cholesterol Cal 14 5 - 40 mg/dL   LDL Chol Calc (NIH) 35 0 -  99 mg/dL   Chol/HDL Ratio 1.9 0.0 - 4.4 ratio    Comment:                                   T. Chol/HDL Ratio                                             Men  Women                               1/2 Avg.Risk  3.4    3.3                                    Avg.Risk  5.0    4.4                                2X Avg.Risk  9.6    7.1                                3X Avg.Risk 23.4   11.0       Assessment & Plan:  Cologuard order placed.  Mammogram order placed.  Pap smear at next visit.  Continue all medications.   Problem List Items Addressed This Visit       Cardiovascular and Mediastinum   Primary hypertension   Relevant Medications   losartan (COZAAR) 25 MG tablet   rosuvastatin (CRESTOR) 10 MG tablet     Endocrine   Diabetes mellitus (HCC) - Primary   Relevant Medications   losartan (COZAAR) 25 MG tablet   MOUNJARO 10 MG/0.5ML Pen   rosuvastatin (CRESTOR) 10 MG tablet   Hypothyroidism   Relevant Medications   levothyroxine (SYNTHROID) 50 MCG tablet     Other   Mixed hyperlipidemia   Relevant Medications   losartan (COZAAR) 25 MG tablet   rosuvastatin (CRESTOR) 10 MG tablet   Other Visit Diagnoses     Colon cancer screening       Relevant Orders   Cologuard   Encounter for screening mammogram for malignant neoplasm of breast       Relevant Orders   MM 3D SCREENING MAMMOGRAM BILATERAL BREAST       Return in about 6 months (around 12/07/2023) for with blood work prior.   Total time spent: 25 minutes  [employer], NP  06/09/2023   This document may have been prepared by Dragon Voice Recognition software and as such may include unintentional dictation errors.

## 2023-07-02 LAB — COLOGUARD: COLOGUARD: NEGATIVE

## 2023-07-16 ENCOUNTER — Ambulatory Visit
Admission: RE | Admit: 2023-07-16 | Discharge: 2023-07-16 | Disposition: A | Discharge status: Home / Self Care | Payer: Managed Care, Other (non HMO) | Source: Ambulatory Visit | Attending: Cardiology | Admitting: Cardiology

## 2023-07-16 DIAGNOSIS — Z1231 Encounter for screening mammogram for malignant neoplasm of breast: Secondary | ICD-10-CM | POA: Diagnosis present

## 2023-12-05 ENCOUNTER — Encounter: Payer: Self-pay | Admitting: Cardiology

## 2023-12-05 ENCOUNTER — Other Ambulatory Visit

## 2023-12-05 DIAGNOSIS — E782 Mixed hyperlipidemia: Secondary | ICD-10-CM

## 2023-12-05 DIAGNOSIS — I10 Essential (primary) hypertension: Secondary | ICD-10-CM

## 2023-12-05 DIAGNOSIS — E1169 Type 2 diabetes mellitus with other specified complication: Secondary | ICD-10-CM

## 2023-12-05 DIAGNOSIS — E039 Hypothyroidism, unspecified: Secondary | ICD-10-CM

## 2023-12-06 LAB — LIPID PANEL
Chol/HDL Ratio: 1.9 ratio (ref 0.0–4.4)
Cholesterol, Total: 115 mg/dL (ref 100–199)
HDL: 62 mg/dL (ref >39)
LDL Chol Calc (NIH): 39 mg/dL (ref 0–99)
Triglycerides: 62 mg/dL (ref 0–149)
VLDL Cholesterol Cal: 14 mg/dL (ref 5–40)

## 2023-12-06 LAB — CMP14+EGFR
ALT: 18 IU/L (ref 0–32)
AST: 25 IU/L (ref 0–40)
Albumin: 4.1 g/dL (ref 3.9–4.9)
Alkaline Phosphatase: 81 IU/L (ref 44–121)
BUN/Creatinine Ratio: 24 (ref 12–28)
BUN: 17 mg/dL (ref 8–27)
Bilirubin Total: 0.7 mg/dL (ref 0.0–1.2)
CO2: 27 mmol/L (ref 20–29)
Calcium: 9 mg/dL (ref 8.7–10.3)
Chloride: 104 mmol/L (ref 96–106)
Creatinine, Ser: 0.7 mg/dL (ref 0.57–1.00)
Globulin, Total: 2.1 g/dL (ref 1.5–4.5)
Glucose: 84 mg/dL (ref 70–99)
Potassium: 4.1 mmol/L (ref 3.5–5.2)
Sodium: 143 mmol/L (ref 134–144)
Total Protein: 6.2 g/dL (ref 6.0–8.5)
eGFR: 98 mL/min/{1.73_m2} (ref >59)

## 2023-12-06 LAB — TSH: TSH: 2.01 u[IU]/mL (ref 0.450–4.500)

## 2023-12-06 LAB — HEMOGLOBIN A1C
Est. average glucose Bld gHb Est-mCnc: 114 mg/dL
Hgb A1c MFr Bld: 5.6 % (ref 4.8–5.6)

## 2023-12-08 ENCOUNTER — Ambulatory Visit: Payer: Managed Care, Other (non HMO) | Admitting: Cardiology

## 2023-12-08 ENCOUNTER — Encounter: Payer: Self-pay | Admitting: Cardiology

## 2023-12-08 VITALS — BP 122/80 | HR 80 | Ht 61.0 in | Wt 125.0 lb

## 2023-12-08 DIAGNOSIS — E782 Mixed hyperlipidemia: Secondary | ICD-10-CM

## 2023-12-08 DIAGNOSIS — I10 Essential (primary) hypertension: Secondary | ICD-10-CM

## 2023-12-08 DIAGNOSIS — Z713 Dietary counseling and surveillance: Secondary | ICD-10-CM | POA: Diagnosis not present

## 2023-12-08 DIAGNOSIS — E039 Hypothyroidism, unspecified: Secondary | ICD-10-CM | POA: Diagnosis not present

## 2023-12-08 NOTE — Progress Notes (Signed)
 Established Patient Office Visit  Subjective:  Patient ID: Shannon Sampson, female    DOB: 10/13/1960  Age: 63 y.o. MRN: [id number]  Chief Complaint  Patient presents with   Follow-up    6 month lab results    Patient in office for 6 month follow up, discuss recent lab results. Patient doing well, no complaints today.  Discussed recent lab work, LDL at goal, Hgb A1c improved. Continue same medications.  Discussed weight loss, continue current dose of Mounjaro . Continue with diet and exercise for long term weight loss.     No other concerns at this time.   Past Medical History:  Diagnosis Date   Anxiety    Diabetes mellitus without complication (HCC)    GERD (gastroesophageal reflux disease)    Hypertension    Thyroid disease     Past Surgical History:  Procedure Laterality Date   BREAST BIOPSY Right 11/28/2016   neg   BREAST BIOPSY Left 03/20/2022   Left Breast Stereo BX, Coil Clip- FIBROADENOMATOID CHANGES   CHOLECYSTECTOMY      Social History   Socioeconomic History   Marital status: Married    Spouse name: Not on file   Number of children: Not on file   Years of education: Not on file   Highest education level: Not on file  Occupational History   Not on file  Tobacco Use   Smoking status: Never   Smokeless tobacco: Not on file  Substance and Sexual Activity   Alcohol use: No   Drug use: Not on file   Sexual activity: Not on file  Other Topics Concern   Not on file  Social History Narrative   Not on file   Social Drivers of Health   Financial Resource Strain: Not on file  Food Insecurity: Not on file  Transportation Needs: Not on file  Physical Activity: Not on file  Stress: Not on file  Social Connections: Not on file  Intimate Partner Violence: Not on file    Family History  Problem Relation Age of Onset   Breast cancer Neg Hx     Allergies  Allergen Reactions   Cephalexin Rash   Propoxyphene Nausea Only    Outpatient Medications  Prior to Visit  Medication Sig   cetirizine  (ZYRTEC  ALLERGY) 10 MG tablet Take 1 tablet (10 mg total) by mouth daily.   cyclobenzaprine  (FLEXERIL ) 10 MG tablet Take 1 tablet (10 mg total) by mouth 3 (three) times daily as needed for muscle spasms.   levothyroxine  (SYNTHROID ) 50 MCG tablet Take 1 tablet (50 mcg total) by mouth daily before breakfast.   losartan  (COZAAR ) 25 MG tablet Take 0.5 tablets (12.5 mg total) by mouth daily.   MOUNJARO  10 MG/0.5ML Pen ADMINISTER 10 MG UNDER THE SKIN 1 TIME A WEEK   rosuvastatin  (CRESTOR ) 10 MG tablet Take 1 tablet (10 mg total) by mouth at bedtime.   valACYclovir  (VALTREX ) 1000 MG tablet Take 1 tablet (1,000 mg total) by mouth 2 (two) times daily.   [DISCONTINUED] ciclopirox  (PENLAC ) 8 % solution Apply topically at bedtime. Apply over nail and surrounding skin. Apply daily over previous coat. After seven (7) days, may remove with alcohol and continue cycle. (Patient not taking: Reported on 12/08/2023)   No facility-administered medications prior to visit.    Review of Systems  Constitutional: Negative.   HENT: Negative.    Eyes: Negative.   Respiratory: Negative.  Negative for shortness of breath.   Cardiovascular: Negative.  Negative for chest  pain.  Gastrointestinal: Negative.  Negative for abdominal pain, constipation and diarrhea.  Genitourinary: Negative.   Musculoskeletal:  Negative for joint pain and myalgias.  Skin: Negative.   Neurological: Negative.  Negative for dizziness and headaches.  Endo/Heme/Allergies: Negative.   All other systems reviewed and are negative.      Objective:   BP 122/80   Pulse 80   Ht 5\' 1"  (1.549 m)   Wt 125 lb (56.7 kg)   SpO2 98%   BMI 23.62 kg/m   Vitals:   12/08/23 0906  BP: 122/80  Pulse: 80  Height: 5\' 1"  (1.549 m)  Weight: 125 lb (56.7 kg)  SpO2: 98%  BMI (Calculated): 23.63    Physical Exam Vitals and nursing note reviewed.  Constitutional:      Appearance: Normal appearance. She is  normal weight.  HENT:     Head: Normocephalic and atraumatic.     Nose: Nose normal.     Mouth/Throat:     Mouth: Mucous membranes are moist.  Eyes:     Extraocular Movements: Extraocular movements intact.     Conjunctiva/sclera: Conjunctivae normal.     Pupils: Pupils are equal, round, and reactive to light.  Cardiovascular:     Rate and Rhythm: Normal rate and regular rhythm.     Pulses: Normal pulses.     Heart sounds: Normal heart sounds.  Pulmonary:     Effort: Pulmonary effort is normal.     Breath sounds: Normal breath sounds.  Abdominal:     General: Abdomen is flat. Bowel sounds are normal.     Palpations: Abdomen is soft.  Musculoskeletal:        General: Normal range of motion.     Cervical back: Normal range of motion.  Skin:    General: Skin is warm and dry.  Neurological:     General: No focal deficit present.     Mental Status: She is alert and oriented to person, place, and time.  Psychiatric:        Mood and Affect: Mood normal.        Behavior: Behavior normal.        Thought Content: Thought content normal.        Judgment: Judgment normal.      No results found for any visits on 12/08/23.  Recent Results (from the past 2160 hours)  Hemoglobin A1c     Status: None   Collection Time: 12/05/23  8:47 AM  Result Value Ref Range   Hgb A1c MFr Bld 5.6 4.8 - 5.6 %    Comment:          Prediabetes: 5.7 - 6.4          Diabetes: >6.4          Glycemic control for adults with diabetes: <7.0    Est. average glucose Bld gHb Est-mCnc 114 mg/dL  TSH     Status: None   Collection Time: 12/05/23  8:47 AM  Result Value Ref Range   TSH 2.010 0.450 - 4.500 uIU/mL  CMP14+EGFR     Status: None   Collection Time: 12/05/23  8:47 AM  Result Value Ref Range   Glucose 84 70 - 99 mg/dL   BUN 17 8 - 27 mg/dL   Creatinine, Ser 4.09 0.57 - 1.00 mg/dL   eGFR 98 >81 XB/JYN/8.29   BUN/Creatinine Ratio 24 12 - 28   Sodium 143 134 - 144 mmol/L   Potassium 4.1 3.5 - 5.2  mmol/L   Chloride 104 96 - 106 mmol/L   CO2 27 20 - 29 mmol/L   Calcium  9.0 8.7 - 10.3 mg/dL   Total Protein 6.2 6.0 - 8.5 g/dL   Albumin 4.1 3.9 - 4.9 g/dL   Globulin, Total 2.1 1.5 - 4.5 g/dL   Bilirubin Total 0.7 0.0 - 1.2 mg/dL   Alkaline Phosphatase 81 44 - 121 IU/L   AST 25 0 - 40 IU/L   ALT 18 0 - 32 IU/L  Lipid panel     Status: None   Collection Time: 12/05/23  8:47 AM  Result Value Ref Range   Cholesterol, Total 115 100 - 199 mg/dL   Triglycerides 62 0 - 149 mg/dL   HDL 62 >16 mg/dL   VLDL Cholesterol Cal 14 5 - 40 mg/dL   LDL Chol Calc (NIH) 39 0 - 99 mg/dL   Chol/HDL Ratio 1.9 0.0 - 4.4 ratio    Comment:                                   T. Chol/HDL Ratio                                             Men  Women                               1/2 Avg.Risk  3.4    3.3                                   Avg.Risk  5.0    4.4                                2X Avg.Risk  9.6    7.1                                3X Avg.Risk 23.4   11.0       Assessment & Plan:  Continue same medications.   Problem List Items Addressed This Visit       Cardiovascular and Mediastinum   Primary hypertension - Primary     Endocrine   Hypothyroidism     Other   Mixed hyperlipidemia   Weight loss counseling, encounter for    Return in about 6 months (around 06/09/2024) for with fasting labs prior.   Total time spent: 25 minutes  [employer], NP  12/08/2023   This document may have been prepared by Dragon Voice Recognition software and as such may include unintentional dictation errors.

## 2023-12-12 ENCOUNTER — Other Ambulatory Visit: Payer: Self-pay

## 2023-12-12 MED ORDER — CETIRIZINE HCL 10 MG PO TABS: 10.0000 mg | ORAL_TABLET | Freq: Every day | ORAL | 2 refills | Status: DC

## 2023-12-12 MED ORDER — CETIRIZINE HCL 10 MG PO TABS
10.0000 mg | ORAL_TABLET | Freq: Every day | ORAL | 2 refills | Status: DC
Start: 2023-12-12 — End: 2023-12-12

## 2024-03-01 ENCOUNTER — Other Ambulatory Visit: Payer: Self-pay | Admitting: Cardiology

## 2024-03-31 ENCOUNTER — Other Ambulatory Visit: Payer: Self-pay | Admitting: Cardiology

## 2024-04-15 ENCOUNTER — Other Ambulatory Visit: Payer: Self-pay

## 2024-04-15 DIAGNOSIS — E1169 Type 2 diabetes mellitus with other specified complication: Secondary | ICD-10-CM

## 2024-04-15 MED ORDER — MOUNJARO 10 MG/0.5ML ~~LOC~~ SOAJ: SUBCUTANEOUS | 11 refills | Status: AC

## 2024-05-14 ENCOUNTER — Other Ambulatory Visit

## 2024-05-14 DIAGNOSIS — E1169 Type 2 diabetes mellitus with other specified complication: Secondary | ICD-10-CM

## 2024-05-14 DIAGNOSIS — I10 Essential (primary) hypertension: Secondary | ICD-10-CM

## 2024-05-14 DIAGNOSIS — E039 Hypothyroidism, unspecified: Secondary | ICD-10-CM

## 2024-05-14 DIAGNOSIS — E782 Mixed hyperlipidemia: Secondary | ICD-10-CM

## 2024-05-15 LAB — CMP14+EGFR
ALT: 21 IU/L (ref 0–32)
AST: 28 IU/L (ref 0–40)
Albumin: 4.1 g/dL (ref 3.9–4.9)
Alkaline Phosphatase: 85 IU/L (ref 49–135)
BUN/Creatinine Ratio: 29 — ABNORMAL HIGH (ref 12–28)
BUN: 19 mg/dL (ref 8–27)
Bilirubin Total: 0.6 mg/dL (ref 0.0–1.2)
CO2: 26 mmol/L (ref 20–29)
Calcium: 8.8 mg/dL (ref 8.7–10.3)
Chloride: 103 mmol/L (ref 96–106)
Creatinine, Ser: 0.65 mg/dL (ref 0.57–1.00)
Globulin, Total: 1.7 g/dL (ref 1.5–4.5)
Glucose: 66 mg/dL — ABNORMAL LOW (ref 70–99)
Potassium: 4 mmol/L (ref 3.5–5.2)
Sodium: 143 mmol/L (ref 134–144)
Total Protein: 5.8 g/dL — ABNORMAL LOW (ref 6.0–8.5)
eGFR: 99 mL/min/1.73 (ref >59)

## 2024-05-15 LAB — TSH: TSH: 2.11 u[IU]/mL (ref 0.450–4.500)

## 2024-05-15 LAB — LIPID PANEL
Chol/HDL Ratio: 2.1 ratio (ref 0.0–4.4)
Cholesterol, Total: 105 mg/dL (ref 100–199)
HDL: 51 mg/dL (ref >39)
LDL Chol Calc (NIH): 37 mg/dL (ref 0–99)
Triglycerides: 89 mg/dL (ref 0–149)
VLDL Cholesterol Cal: 17 mg/dL (ref 5–40)

## 2024-05-15 LAB — HEMOGLOBIN A1C
Est. average glucose Bld gHb Est-mCnc: 105 mg/dL
Hgb A1c MFr Bld: 5.3 % (ref 4.8–5.6)

## 2024-05-17 ENCOUNTER — Ambulatory Visit: Payer: Self-pay | Admitting: Cardiology

## 2024-05-21 ENCOUNTER — Ambulatory Visit: Admitting: Cardiology

## 2024-05-21 ENCOUNTER — Encounter: Payer: Self-pay | Admitting: Cardiology

## 2024-05-21 VITALS — BP 114/62 | HR 79 | Ht 61.0 in | Wt 121.0 lb

## 2024-05-21 DIAGNOSIS — E039 Hypothyroidism, unspecified: Secondary | ICD-10-CM

## 2024-05-21 DIAGNOSIS — I10 Essential (primary) hypertension: Secondary | ICD-10-CM

## 2024-05-21 DIAGNOSIS — E119 Type 2 diabetes mellitus without complications: Secondary | ICD-10-CM

## 2024-05-21 DIAGNOSIS — E782 Mixed hyperlipidemia: Secondary | ICD-10-CM

## 2024-05-21 MED ORDER — LEVOTHYROXINE SODIUM 50 MCG PO TABS: 50.0000 ug | ORAL_TABLET | Freq: Every day | ORAL | 3 refills | Status: AC

## 2024-05-21 MED ORDER — DOXYCYCLINE HYCLATE 100 MG PO TABS: 100.0000 mg | ORAL_TABLET | Freq: Two times a day (BID) | ORAL | 0 refills | Status: AC

## 2024-05-21 MED ORDER — ALBUTEROL SULFATE HFA 108 (90 BASE) MCG/ACT IN AERS: 1.0000 | INHALATION_SPRAY | Freq: Four times a day (QID) | RESPIRATORY_TRACT | 6 refills | Status: AC | PRN

## 2024-05-21 MED ORDER — ROSUVASTATIN CALCIUM 10 MG PO TABS: 10.0000 mg | ORAL_TABLET | Freq: Every day | ORAL | 3 refills | Status: AC

## 2024-05-21 MED ORDER — TOBRAMYCIN 0.3 % OP SOLN: 1.0000 [drp] | OPHTHALMIC | 7 refills | Status: AC

## 2024-05-21 NOTE — Progress Notes (Signed)
 Established Patient Office Visit  Subjective:  Patient ID: Shannon Sampson, female    DOB: [birth year]  Age: 63 y.o. MRN: [id number]  Chief Complaint  Patient presents with   Follow-up    6 months follow up    Patient in office for 6 month follow up, discuss recent lab results. Patient doing well. Patient reports having an ingrown toe nail on her right foot great toe. Patient reports trying to treat it herself, now the toe is red and swollen. Patient allergic to cephalexin. Will send in doxycyline.  Discussed recent lab work, within normal limits.  Cologuard 06/2023 negative.  Continue current medications.     No other concerns at this time.   Past Medical History:  Diagnosis Date   Anxiety    Diabetes mellitus without complication (HCC)    GERD (gastroesophageal reflux disease)    Hypertension    Thyroid disease     Past Surgical History:  Procedure Laterality Date   BREAST BIOPSY Right 11/28/2016   neg   BREAST BIOPSY Left 03/20/2022   Left Breast Stereo BX, Coil Clip- FIBROADENOMATOID CHANGES   CHOLECYSTECTOMY      Social History   Socioeconomic History   Marital status: Married    Spouse name: Not on file   Number of children: Not on file   Years of education: Not on file   Highest education level: Not on file  Occupational History   Not on file  Tobacco Use   Smoking status: Never   Smokeless tobacco: Not on file  Substance and Sexual Activity   Alcohol use: No   Drug use: Not on file   Sexual activity: Not on file  Other Topics Concern   Not on file  Social History Narrative   Not on file   Social Drivers of Health   Financial Resource Strain: Not on file  Food Insecurity: Not on file  Transportation Needs: Not on file  Physical Activity: Not on file  Stress: Not on file  Social Connections: Not on file  Intimate Partner Violence: Not on file    Family History  Problem Relation Age of Onset   Breast cancer Neg Hx     Allergies   Allergen Reactions   Cephalexin Rash   Propoxyphene Nausea Only    Outpatient Medications Prior to Visit  Medication Sig   cetirizine  (ZYRTEC ) 10 MG tablet TAKE 1 TABLET(10 MG) BY MOUTH DAILY   cyclobenzaprine  (FLEXERIL ) 10 MG tablet TAKE 1 TABLET BY MOUTH THREE TIMES DAILY AS NEEDED FOR MUSCLE SPASMS   losartan  (COZAAR ) 25 MG tablet Take 0.5 tablets (12.5 mg total) by mouth daily.   MOUNJARO  10 MG/0.5ML Pen ADMINISTER 10 MG UNDER THE SKIN 1 TIME A WEEK   mupirocin ointment (BACTROBAN) 2 % 1 Application 2 (two) times daily.   valACYclovir  (VALTREX ) 1000 MG tablet Take 1 tablet (1,000 mg total) by mouth 2 (two) times daily.   [DISCONTINUED] albuterol (PROVENTIL HFA) 108 (90 Base) MCG/ACT inhaler Inhale into the lungs every 6 (six) hours as needed for wheezing or shortness of breath.   [DISCONTINUED] levothyroxine  (SYNTHROID ) 50 MCG tablet Take 1 tablet (50 mcg total) by mouth daily before breakfast.   [DISCONTINUED] rosuvastatin  (CRESTOR ) 10 MG tablet Take 1 tablet (10 mg total) by mouth at bedtime.   [DISCONTINUED] tobramycin (TOBREX) 0.3 % ophthalmic solution every 4 (four) hours.   No facility-administered medications prior to visit.    Review of Systems  Constitutional: Negative.   HENT: Negative.  Eyes: Negative.   Respiratory: Negative.  Negative for shortness of breath.   Cardiovascular: Negative.  Negative for chest pain.  Gastrointestinal: Negative.  Negative for abdominal pain, constipation and diarrhea.  Genitourinary: Negative.   Musculoskeletal:  Negative for joint pain and myalgias.  Skin: Negative.   Neurological: Negative.  Negative for dizziness and headaches.  Endo/Heme/Allergies: Negative.   All other systems reviewed and are negative.      Objective:   BP 114/62   Pulse 79   Ht 5' 1 (1.549 m)   Wt 121 lb (54.9 kg)   SpO2 98%   BMI 22.86 kg/m   Vitals:   05/21/24 0907  BP: 114/62  Pulse: 79  Height: 5' 1 (1.549 m)  Weight: 121 lb (54.9 kg)   SpO2: 98%  BMI (Calculated): 22.87    Physical Exam Vitals and nursing note reviewed.  Constitutional:      Appearance: Normal appearance. She is normal weight.  HENT:     Head: Normocephalic and atraumatic.     Nose: Nose normal.     Mouth/Throat:     Mouth: Mucous membranes are moist.  Eyes:     Extraocular Movements: Extraocular movements intact.     Conjunctiva/sclera: Conjunctivae normal.     Pupils: Pupils are equal, round, and reactive to light.  Cardiovascular:     Rate and Rhythm: Normal rate and regular rhythm.     Pulses: Normal pulses.     Heart sounds: Normal heart sounds.  Pulmonary:     Effort: Pulmonary effort is normal.     Breath sounds: Normal breath sounds.  Abdominal:     General: Abdomen is flat. Bowel sounds are normal.     Palpations: Abdomen is soft.  Musculoskeletal:        General: Normal range of motion.     Cervical back: Normal range of motion.  Skin:    General: Skin is warm and dry.  Neurological:     General: No focal deficit present.     Mental Status: She is alert and oriented to person, place, and time.  Psychiatric:        Mood and Affect: Mood normal.        Behavior: Behavior normal.        Thought Content: Thought content normal.        Judgment: Judgment normal.      No results found for any visits on 05/21/24.  Recent Results (from the past 2160 hours)  Lipid panel     Status: None   Collection Time: 05/14/24  9:14 AM  Result Value Ref Range   Cholesterol, Total 105 100 - 199 mg/dL   Triglycerides 89 0 - 149 mg/dL   HDL 51 >60 mg/dL   VLDL Cholesterol Cal 17 5 - 40 mg/dL   LDL Chol Calc (NIH) 37 0 - 99 mg/dL   Chol/HDL Ratio 2.1 0.0 - 4.4 ratio    Comment:                                   T. Chol/HDL Ratio                                             Men  Women  1/2 Avg.Risk  3.4    3.3                                   Avg.Risk  5.0    4.4                                2X Avg.Risk   9.6    7.1                                3X Avg.Risk 23.4   11.0   CMP14+EGFR     Status: Abnormal   Collection Time: 05/14/24  9:14 AM  Result Value Ref Range   Glucose 66 (L) 70 - 99 mg/dL   BUN 19 8 - 27 mg/dL   Creatinine, Ser 9.34 0.57 - 1.00 mg/dL   eGFR 99 >40 fO/fpw/8.26   BUN/Creatinine Ratio 29 (H) 12 - 28   Sodium 143 134 - 144 mmol/L   Potassium 4.0 3.5 - 5.2 mmol/L   Chloride 103 96 - 106 mmol/L   CO2 26 20 - 29 mmol/L   Calcium  8.8 8.7 - 10.3 mg/dL   Total Protein 5.8 (L) 6.0 - 8.5 g/dL   Albumin 4.1 3.9 - 4.9 g/dL   Globulin, Total 1.7 1.5 - 4.5 g/dL   Bilirubin Total 0.6 0.0 - 1.2 mg/dL   Alkaline Phosphatase 85 49 - 135 IU/L   AST 28 0 - 40 IU/L   ALT 21 0 - 32 IU/L  TSH     Status: None   Collection Time: 05/14/24  9:14 AM  Result Value Ref Range   TSH 2.110 0.450 - 4.500 uIU/mL  Hemoglobin A1c     Status: None   Collection Time: 05/14/24  9:14 AM  Result Value Ref Range   Hgb A1c MFr Bld 5.3 4.8 - 5.6 %    Comment:          Prediabetes: 5.7 - 6.4          Diabetes: >6.4          Glycemic control for adults with diabetes: <7.0    Est. average glucose Bld gHb Est-mCnc 105 mg/dL      Assessment & Plan:  Doxycycline Continue current medications  Problem List Items Addressed This Visit       Cardiovascular and Mediastinum   Primary hypertension - Primary   Relevant Medications   rosuvastatin  (CRESTOR ) 10 MG tablet     Endocrine   Diabetes mellitus (HCC)   Relevant Medications   rosuvastatin  (CRESTOR ) 10 MG tablet   Hypothyroidism   Relevant Medications   levothyroxine  (SYNTHROID ) 50 MCG tablet     Other   Mixed hyperlipidemia   Relevant Medications   rosuvastatin  (CRESTOR ) 10 MG tablet    Return in about 6 months (around 11/19/2024) for fasting lab work prior.   Total time spent: 25 minutes. This time includes review of previous notes and results and patient face to face interaction during today's visit.    Jeoffrey Pollen,  NP  05/21/2024   This document may have been prepared by Dragon Voice Recognition software and as such may include unintentional dictation errors.

## 2024-06-03 ENCOUNTER — Other Ambulatory Visit: Payer: Self-pay | Admitting: Cardiology

## 2024-06-03 ENCOUNTER — Telehealth: Payer: Self-pay

## 2024-06-03 DIAGNOSIS — Z1231 Encounter for screening mammogram for malignant neoplasm of breast: Secondary | ICD-10-CM

## 2024-06-03 NOTE — Telephone Encounter (Signed)
 Can you put in the order for the patient to get her mammogram done please she said her last one ws 12/11 so it'll have to be scheduled after that and any time after 12

## 2024-06-19 ENCOUNTER — Other Ambulatory Visit: Payer: Self-pay | Admitting: Cardiology

## 2024-07-16 ENCOUNTER — Ambulatory Visit
Admission: RE | Admit: 2024-07-16 | Discharge: 2024-07-16 | Disposition: A | Source: Ambulatory Visit | Attending: Cardiology

## 2024-07-16 DIAGNOSIS — Z1231 Encounter for screening mammogram for malignant neoplasm of breast: Secondary | ICD-10-CM

## 2024-08-27 ENCOUNTER — Other Ambulatory Visit: Payer: Self-pay

## 2024-08-27 MED ORDER — CYCLOBENZAPRINE HCL 10 MG PO TABS: 10.0000 mg | ORAL_TABLET | Freq: Three times a day (TID) | ORAL | 5 refills | Status: AC | PRN

## 2024-08-31 MED ORDER — LOSARTAN POTASSIUM 25 MG PO TABS: 12.5000 mg | ORAL_TABLET | Freq: Every day | ORAL | 0 refills | Status: AC

## 2024-11-19 ENCOUNTER — Ambulatory Visit: Admitting: Cardiology
